# Patient Record
Sex: Male | Born: 2005
Health system: Southern US, Community
[De-identification: ages and names within clinical notes are randomized; demographics above are authoritative.]

## PROBLEM LIST (undated history)

## (undated) DIAGNOSIS — S90822A Blister (nonthermal), left foot, initial encounter: Secondary | ICD-10-CM

## (undated) DIAGNOSIS — T7840XA Allergy, unspecified, initial encounter: Secondary | ICD-10-CM

## (undated) DIAGNOSIS — H6690 Otitis media, unspecified, unspecified ear: Secondary | ICD-10-CM

## (undated) DIAGNOSIS — F909 Attention-deficit hyperactivity disorder, unspecified type: Secondary | ICD-10-CM

## (undated) HISTORY — PX: TYMPANOSTOMY TUBE PLACEMENT: SHX32

---
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
## 2024-01-30 ENCOUNTER — Other Ambulatory Visit: Payer: Self-pay

## 2024-01-30 MED ORDER — AMPHET-DEXTROAMPHET 3-BEAD ER 25 MG PO CP24
25.0000 mg | ORAL_CAPSULE | Freq: Every morning | ORAL | 0 refills | Status: DC
  Filled 2024-01-30: qty 30, 30d supply, fill #0

## 2024-03-12 ENCOUNTER — Other Ambulatory Visit: Payer: Self-pay

## 2024-03-15 ENCOUNTER — Other Ambulatory Visit: Payer: Self-pay

## 2024-03-17 ENCOUNTER — Other Ambulatory Visit: Payer: Self-pay

## 2024-03-17 ENCOUNTER — Telehealth: Payer: Self-pay | Admitting: Family Medicine

## 2024-03-17 NOTE — Telephone Encounter (Signed)
 Medication not on profile, Mydayis

## 2024-03-17 NOTE — Telephone Encounter (Unsigned)
 Copied from CRM [id number]. Topic: Clinical - Medication Refill >> Mar 17, 2024  4:35 PM Deleta RAMAN wrote: Medication: Mydayis   Has the patient contacted their pharmacy? Yes (Agent: If no, request that the patient contact the pharmacy for the refill. If patient does not wish to contact the pharmacy document the reason why and proceed with request.) (Agent: If yes, when and what did the pharmacy advise?)  This is the patient's preferred pharmacy:  [facility] [address] [place] [address] Phone: [phone] Fax: [phone]  Is this the correct pharmacy for this prescription? Yes If no, delete pharmacy and type the correct one.   Has the prescription been filled recently? Yes   Is the patient out of the medication? No, has 7 pills remaining   Has the patient been seen for an appointment in the last year OR does the patient have an upcoming appointment? Yes  Can we respond through MyChart? No  Please give his mom a call or text regarding updates on prescription at [phone]. Leave voicemail if possible  Agent: Please be advised that Rx refills may take up to 3 business days. We ask that you follow-up with your pharmacy.

## 2024-03-18 ENCOUNTER — Other Ambulatory Visit: Payer: Self-pay

## 2024-03-18 NOTE — Telephone Encounter (Signed)
 Attempted to reach parent at provided number. LM advising that we do not refill any prescriptions until patient has established care with us  here in the office. Recommend reaching out to previous PCP for refills until upcoming visit with us . Call back with any questions or concerns.

## 2024-03-19 ENCOUNTER — Other Ambulatory Visit: Payer: Self-pay

## 2024-03-19 MED ORDER — AMPHET-DEXTROAMPHET 3-BEAD ER 25 MG PO CP24
25.0000 mg | ORAL_CAPSULE | Freq: Every morning | ORAL | 0 refills | Status: DC
  Filled 2024-03-19: qty 30, 30d supply, fill #0

## 2024-04-11 LAB — COMPREHENSIVE METABOLIC PANEL WITH GFR
Albumin: 5 (ref 3.5–5.0)
Globulin: 1.8

## 2024-04-11 LAB — LIPID PANEL
Cholesterol: 179 (ref 0–200)
HDL: 56 (ref 35–70)
LDL Cholesterol: 111
Triglycerides: 54 (ref 40–160)

## 2024-04-11 LAB — HEMOGLOBIN A1C
EGFR: 148.9
HM HIV Screening: NEGATIVE
Hemoglobin A1C: 5.2

## 2024-04-11 LAB — HEPATIC FUNCTION PANEL
ALT: 42 U/L — AB (ref 10–40)
AST: 51 — AB (ref 14–40)
Alkaline Phosphatase: 93 (ref 25–125)
Bilirubin, Total: 0.8

## 2024-04-11 LAB — BASIC METABOLIC PANEL WITH GFR
BUN: 21 (ref 4–21)
Creatinine: 0.9 (ref 0.6–1.3)
Glucose: 60

## 2024-04-14 ENCOUNTER — Ambulatory Visit: Payer: Self-pay | Admitting: Family Medicine

## 2024-04-19 ENCOUNTER — Ambulatory Visit (INDEPENDENT_AMBULATORY_CARE_PROVIDER_SITE_OTHER): Payer: Self-pay | Admitting: Family Medicine

## 2024-04-19 ENCOUNTER — Other Ambulatory Visit: Payer: Self-pay

## 2024-04-19 ENCOUNTER — Encounter: Payer: Self-pay | Admitting: Family Medicine

## 2024-04-19 VITALS — BP 122/50 | HR 90 | Ht 70.0 in | Wt 155.4 lb

## 2024-04-19 DIAGNOSIS — F902 Attention-deficit hyperactivity disorder, combined type: Secondary | ICD-10-CM | POA: Diagnosis not present

## 2024-04-19 DIAGNOSIS — Z7689 Persons encountering health services in other specified circumstances: Secondary | ICD-10-CM | POA: Diagnosis not present

## 2024-04-19 DIAGNOSIS — F419 Anxiety disorder, unspecified: Secondary | ICD-10-CM

## 2024-04-19 MED ORDER — AMPHET-DEXTROAMPHET 3-BEAD ER 25 MG PO CP24
25.0000 mg | ORAL_CAPSULE | Freq: Every day | ORAL | 0 refills | Status: DC | PRN
  Filled 2024-07-29: qty 30, 30d supply, fill #0

## 2024-04-19 MED ORDER — AMPHETAMINE-DEXTROAMPHET ER 10 MG PO CP24
10.0000 mg | ORAL_CAPSULE | Freq: Every day | ORAL | 0 refills | Status: DC | PRN
  Filled 2024-04-26: qty 30, 30d supply, fill #0
  Filled ????-??-??: fill #0

## 2024-04-19 MED ORDER — AMPHET-DEXTROAMPHET 3-BEAD ER 25 MG PO CP24
25.0000 mg | ORAL_CAPSULE | Freq: Every day | ORAL | 0 refills | Status: DC | PRN
  Filled 2024-06-16: qty 30, 30d supply, fill #0

## 2024-04-19 MED ORDER — AMPHET-DEXTROAMPHET 3-BEAD ER 25 MG PO CP24
25.0000 mg | ORAL_CAPSULE | Freq: Every day | ORAL | 0 refills | Status: DC | PRN
  Filled 2024-04-26: qty 30, 30d supply, fill #0
  Filled ????-??-??: fill #0

## 2024-04-19 NOTE — Patient Instructions (Addendum)
 Thank you for coming to the office today.  Mydais generic Adderall  25 mg 3 orders sent Fill dates 8/25, 9/22, 10/20  Reviewing the recent labs  Mild elevated AST liver enzyme, 51. Goal is to repeat in 1 year and follow up on this. It is more of a curiosity, not a concern.  Cholesterol LDL 111 possibly higher based on age.  DUE for FASTING BLOOD WORK (no food or drink after midnight before the lab appointment, only water or coffee without cream/sugar on the morning of)  SCHEDULE Lab Only visit in the morning at the clinic for lab draw in 1 YEAR  - Make sure Lab Only appointment is at about 1 week before your next appointment, so that results will be available  For Lab Results, once available within 2-3 days of blood draw, you can can log in to MyChart online to view your results and a brief explanation. Also, we can discuss results at next follow-up visit.   Please schedule a Follow-up Appointment to: Return for 6 month ADHD refill mychart virtual / 1 year fasting lab > 1 week later Annual Physical.  If you have any other questions or concerns, please feel free to call the office or send a message through MyChart. You may also schedule an earlier appointment if necessary.  Additionally, you may be receiving a survey about your experience at our office within a few days to 1 week by e-mail or mail. We value your feedback.  Marsa Officer, DO [facility], [place]

## 2024-04-19 NOTE — Progress Notes (Signed)
 Subjective:    Patient ID: [name] [name], male    DOB: 07/28/2006, 18 y.o.   MRN: [id number]  [name] [name] is a 18 y.o. male presenting on 04/19/2024 for Establish Care   HPI  Discussed the use of AI scribe software for clinical note transcription with the patient, who gave verbal consent to proceed.  History of Present Illness   [name] [name] is a 18 year old male with ADHD who presents for a new patient welcome visit and medication management. He is accompanied by his mother.  Attention deficit hyperactivity symptoms - Diagnosed with ADHD in kindergarten - Symptoms include decreased focus and hyperactivity - Condition has remained steady over the years - Adapted to symptoms with current routines - Currently taking generic Adderall  XR Mydayis 25 mg on school and work days - Takes Adderall  10 mg on weekends or days requiring task completion - Physicist, medical at [employer]  Mild Anxiety symptoms - Experiencing anxiety, particularly over the past year - Attributes anxiety to work-related stress - Has a support system including family and friends No medications or treatment at this time.  Laboratory findings - Recent life insurance policy laboratory evaluation showed normal cholesterol, triglycerides, glucose, and kidney function - Mild elevation in AST liver enzyme, described as borderline - No significant weight change based on available records - No alcohol consumption  Lifestyle and supplement use - Engages in calisthenics for exercise - Currently taking a multivitamin and omega supplement      Both parents with high cholesterol, no cardiac events Both grandparents with heart disease CABG        No data to display              No data to display           Past Medical History:  Diagnosis Date   ADHD (attention deficit hyperactivity disorder)    Blister of foot, left 12/03/2011   Chronic otitis media  12/2011   current left ear infection; to remove one ear tube and place tubes both ears   Past Surgical History:  Procedure Laterality Date   ADENOIDECTOMY  03/26/2007   with BMT   MYRINGOTOMY WITH TUBE PLACEMENT Bilateral 08/06/2016   Procedure: BILATERAL MYRINGOTOMY WITH  T-TUBE PLACEMENT;  Surgeon: Camellia Milliner, MD;  Location: [facility] OR;  Service: ENT;  Laterality: Bilateral;   TONSILLECTOMY  12/07/2008   with BMT   TYMPANOSTOMY TUBE PLACEMENT  03/26/2007; 05/16/2008; 12/07/2008   Social History   Socioeconomic History   Marital status: Single    Spouse name: Not on file   Number of children: Not on file   Years of education: Not on file   Highest education level: Not on file  Occupational History   Not on file  Tobacco Use   Smoking status: Never   Smokeless tobacco: Never  Vaping Use   Vaping status: Never Used  Substance and Sexual Activity   Alcohol use: Never   Drug use: Never   Sexual activity: Never  Other Topics Concern   Not on file  Social History Narrative   Not on file   Social Drivers of Health   Financial Resource Strain: Not on file  Food Insecurity: Not on file  Transportation Needs: Not on file  Physical Activity: Not on file  Stress: Not on file  Social Connections: Not on file  Intimate Partner Violence: Not on file   Family History  Problem Relation Age of  Onset   Hypothyroidism Mother    Healthy Father    Healthy Brother    Hypertension Maternal Grandmother    Diabetes Maternal Grandfather    Heart disease Maternal Grandfather    COPD Maternal Grandfather    Heart disease Paternal Grandmother    Heart disease Paternal Grandfather    Current Outpatient Medications on File Prior to Visit  Medication Sig   cetirizine  (ZYRTEC  ALLERGY) 10 MG tablet Take 0.5 tablets (5 mg total) by mouth daily.   sodium chloride (OCEAN) 0.65 % SOLN nasal spray Place 1 spray into both nostrils as needed.   cefdinir  (OMNICEF ) 300 MG capsule Take 1 capsule (300 mg total)  by mouth 2 (two) times daily. (Patient not taking: Reported on 06/13/2023)   No current facility-administered medications on file prior to visit.    Review of Systems Per HPI unless specifically indicated above     Objective:    BP (!) 122/50 (BP Location: Left Arm, Patient Position: Sitting, Cuff Size: Small)   Pulse 90   Ht 5' 10 (1.778 m)   Wt 155 lb 6 oz (70.5 kg)   SpO2 99%   BMI 22.29 kg/m   Wt Readings from Last 3 Encounters:  04/19/24 155 lb 6 oz (70.5 kg) (54%, Z= 0.10)*  11/09/18 110 lb (49.9 kg) (52%, Z= 0.05)*  08/31/18 100 lb (45.4 kg) (37%, Z= -0.34)*   * Growth percentiles are based on CDC (Boys, 2-20 Years) data.    Physical Exam Vitals and nursing note reviewed.  Constitutional:      General: He is not in acute distress.    Appearance: Normal appearance. He is well-developed. He is not diaphoretic.     Comments: Well-appearing, comfortable, cooperative  HENT:     Head: Normocephalic and atraumatic.  Eyes:     General:        Right eye: No discharge.        Left eye: No discharge.     Conjunctiva/sclera: Conjunctivae normal.  Cardiovascular:     Rate and Rhythm: Normal rate.  Pulmonary:     Effort: Pulmonary effort is normal.  Skin:    General: Skin is warm and dry.     Findings: No erythema or rash.  Neurological:     Mental Status: He is alert and oriented to person, place, and time.  Psychiatric:        Mood and Affect: Mood normal.        Behavior: Behavior normal.        Thought Content: Thought content normal.     Comments: Well groomed, good eye contact, normal speech and thoughts     Results for orders placed or performed in visit on 04/19/24  Basic metabolic panel with GFR   Collection Time: 04/11/24 12:00 AM  Result Value Ref Range   Glucose 60    BUN 21 4 - 21   Creatinine 0.9 0.6 - 1.3  Comprehensive metabolic panel with GFR   Collection Time: 04/11/24 12:00 AM  Result Value Ref Range   Globulin 1.8    Albumin 5.0 3.5 -  5.0  Lipid panel   Collection Time: 04/11/24 12:00 AM  Result Value Ref Range   Triglycerides 54 40 - 160   Cholesterol 179 0 - 200   HDL 56 35 - 70   LDL Cholesterol 111   Hepatic function panel   Collection Time: 04/11/24 12:00 AM  Result Value Ref Range   Alkaline Phosphatase 93 25 -  125   ALT 42 (A) 10 - 40 U/L   AST 51 (A) 14 - 40   Bilirubin, Total 0.8   Hemoglobin A1c   Collection Time: 04/11/24 12:00 AM  Result Value Ref Range   Hemoglobin A1C 5.2       Assessment & Plan:   Problem List Items Addressed This Visit     Attention deficit hyperactivity disorder (ADHD), combined type - Primary   Relevant Medications   Amphet-Dextroamphet 3-Bead ER 25 MG CP24 (Start on 06/21/2024)   Amphet-Dextroamphet 3-Bead ER 25 MG CP24 (Start on 04/26/2024)   amphetamine -dextroamphetamine  (ADDERALL  XR) 10 MG 24 hr capsule (Start on 04/26/2024)   Amphet-Dextroamphet 3-Bead ER 25 MG CP24 (Start on 05/24/2024)   Other Visit Diagnoses       Encounter to establish care with new doctor         Anxiety            Updated Health Maintenance information Encouraged improvement to lifestyle with diet and exercise   Attention-deficit hyperactivity disorder (ADHD), combined type ADHD symptoms well-managed with Adderall  and Generic Mydayis. Scientist, physiological at [employer] Discussed telehealth for follow-ups. - Prescribe Generic Mydayis 25 mg daily (extended adderall  formulation), three orders with fill dates on 8/25, 9/22, and 10/20. - Prescribe Adderall  10 mg, to be filled on 8/25. - Schedule follow-up in six months for ADHD management, potentially via telehealth. - Instruct to message via MyChart for future refills after the third order is completed.  Mild situational anxiety Mild situational anxiety related to work and school stress. No current psychiatric intervention needed. Support systems in place. Discussed referral to counselor if needed. - Encourage use of support systems,  including family and friends. - Offer referral to a counselor if anxiety becomes unmanageable.  Mildly elevated AST liver enzyme Mildly elevated AST at 51. No significant weight gain or cholesterol issues. Possible age-related or supplement-related elevation. No immediate concern, will monitor. - Repeat liver enzyme test in one year. - Advise to return for blood work if concerns arise before annual check-up.  Possible borderline elevated LDL cholesterol (age-related) LDL possibly borderline elevated for age, within normal range for adults. Family history of heart disease and high cholesterol. No immediate concern, advised healthy diet. - Advise to maintain a healthy diet, including vegetables. - Monitor cholesterol levels during annual check-up.       Future 6 month MyChart Video ADHD refill authorization visit   Orders Placed This Encounter  Procedures   Basic metabolic panel with GFR    This external order was created through the Results Console.   Comprehensive metabolic panel with GFR    This external order was created through the Results Console.   Lipid panel    This external order was created through the Results Console.   Hepatic function panel    This external order was created through the Results Console.   Hemoglobin A1c    This external order was created through the Results Console.    Meds ordered this encounter  Medications   Amphet-Dextroamphet 3-Bead ER 25 MG CP24    Sig: Take 1 capsule (25 mg total) by mouth daily as needed (inattention ADHD).    Dispense:  30 capsule    Refill:  0    First fill 06/21/24   Amphet-Dextroamphet 3-Bead ER 25 MG CP24    Sig: Take 1 capsule (25 mg total) by mouth daily as needed (inattention ADHD).    Dispense:  30 capsule  Refill:  0    First fill 04/26/24   amphetamine -dextroamphetamine  (ADDERALL  XR) 10 MG 24 hr capsule    Sig: Take 1 capsule (10 mg total) by mouth daily as needed (inattention ADHD).    Dispense:  30  capsule    Refill:  0   Amphet-Dextroamphet 3-Bead ER 25 MG CP24    Sig: Take 1 capsule (25 mg total) by mouth daily as needed (inattention ADHD).    Dispense:  30 capsule    Refill:  0    First fill 05/24/24     Follow up plan: Return for 6 month ADHD refill mychart virtual / 1 year fasting lab > 1 week later Annual Physical.  Marsa Officer, DO [facility] 04/19/2024, 10:27 AM

## 2024-04-26 ENCOUNTER — Other Ambulatory Visit: Payer: Self-pay

## 2024-05-14 ENCOUNTER — Other Ambulatory Visit: Payer: Self-pay

## 2024-05-14 ENCOUNTER — Ambulatory Visit: Admitting: Podiatry

## 2024-05-14 DIAGNOSIS — L600 Ingrowing nail: Secondary | ICD-10-CM | POA: Diagnosis not present

## 2024-05-14 MED ORDER — DOXYCYCLINE HYCLATE 100 MG PO TABS
100.0000 mg | ORAL_TABLET | Freq: Two times a day (BID) | ORAL | 0 refills | Status: AC
  Filled 2024-05-14: qty 20, 10d supply, fill #0

## 2024-05-14 NOTE — Progress Notes (Signed)

## 2024-05-14 NOTE — Progress Notes (Signed)
   Chief Complaint  Patient presents with   Toe Pain    Ingrown toe nail right great toe    Subjective: Patient presents today for evaluation of pain to the medial and lateral border of the right great toe. Patient is concerned for possible ingrown nail.  It is very sensitive to touch.  Patient presents today for further treatment and evaluation.  Past Medical History:  Diagnosis Date   ADHD (attention deficit hyperactivity disorder)    Blister of foot, left 12/03/2011   Chronic otitis media 12/2011   current left ear infection; to remove one ear tube and place tubes both ears    Past Surgical History:  Procedure Laterality Date   ADENOIDECTOMY  03/26/2007   with BMT   MYRINGOTOMY WITH TUBE PLACEMENT Bilateral 08/06/2016   Procedure: BILATERAL MYRINGOTOMY WITH  T-TUBE PLACEMENT;  Surgeon: Camellia Milliner, MD;  Location: [facility] OR;  Service: ENT;  Laterality: Bilateral;   TONSILLECTOMY  12/07/2008   with BMT   TYMPANOSTOMY TUBE PLACEMENT  03/26/2007; 05/16/2008; 12/07/2008    No Known Allergies  Objective:  General: Well developed, nourished, in no acute distress, alert and oriented x3   Dermatology: Skin is warm, dry and supple bilateral.  Medial and lateral border right great toe is tender with evidence of an ingrowing nail. Pain on palpation noted to the border of the nail fold. The remaining nails appear unremarkable at this time.   Vascular: DP and PT pulses palpable.  No clinical evidence of vascular compromise  Neruologic: Grossly intact via light touch bilateral.  Musculoskeletal: No pedal deformity noted  Assesement: #1 Paronychia with ingrowing nail medial and lateral border right great toe #2 history of partial nail matricectomy medial and lateral border left great toe  Plan of Care:  -Patient evaluated.  -Discussed treatment alternatives and plan of care. Explained nail avulsion procedure and post procedure course to patient. -Patient opted for permanent partial nail  avulsion of the ingrown portion of the nail.  -Prior to procedure, local anesthesia infiltration utilized using 3 ml of a 50:50 mixture of 2% plain lidocaine  and 0.5% plain marcaine in a normal hallux block fashion and a betadine prep performed.  -Partial permanent nail avulsion with chemical matrixectomy performed using 3x30sec applications of phenol followed by alcohol flush.  -Light dressing applied.  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 Thresa EMERSON Sar, DPM    [address] [place], [place] [address]                Office [phone]  Fax [phone]

## 2024-05-26 DIAGNOSIS — H6983 Other specified disorders of Eustachian tube, bilateral: Secondary | ICD-10-CM | POA: Diagnosis not present

## 2024-06-01 ENCOUNTER — Ambulatory Visit (INDEPENDENT_AMBULATORY_CARE_PROVIDER_SITE_OTHER): Admitting: Podiatry

## 2024-06-01 DIAGNOSIS — L600 Ingrowing nail: Secondary | ICD-10-CM | POA: Diagnosis not present

## 2024-06-01 NOTE — Progress Notes (Signed)
   Chief Complaint  Patient presents with   Ingrown Toenail    Pt is here to f/u on right foot after having ingrown removed he states no pain or drainage from the area.    Subjective: 18 y.o. male presents today status post permanent nail avulsion procedure of the medial and lateral border of the right great toe that was performed on 05/14/2024.   Past Medical History:  Diagnosis Date   ADHD (attention deficit hyperactivity disorder)    Blister of foot, left 12/03/2011   Chronic otitis media 12/2011   current left ear infection; to remove one ear tube and place tubes both ears    Objective: Neurovascular status intact.  Skin is warm, dry and supple. Nail and respective nail fold appears to be healing appropriately.   Assessment: #1 s/p partial permanent nail matrixectomy medial and lateral border right great toe #2 h/o partial nail matrixectomy medial and lateral border left great toe 06/13/2023   Plan of care: -Patient was evaluated  -Light debridement of the periungual debris was performed to the border of the respective toe and nail plate using a tissue nipper. -Patient is to return to clinic on a PRN basis.   Thresa EMERSON Sar, DPM [facility]  Dr. Thresa EMERSON Sar, DPM    [address] [place], [place] [address]                Office [phone]  Fax [phone]

## 2024-06-15 ENCOUNTER — Other Ambulatory Visit: Payer: Self-pay | Admitting: Family Medicine

## 2024-06-15 DIAGNOSIS — F902 Attention-deficit hyperactivity disorder, combined type: Secondary | ICD-10-CM

## 2024-06-16 ENCOUNTER — Other Ambulatory Visit: Payer: Self-pay

## 2024-07-30 ENCOUNTER — Other Ambulatory Visit: Payer: Self-pay

## 2024-09-04 ENCOUNTER — Other Ambulatory Visit: Payer: Self-pay | Admitting: Family Medicine

## 2024-09-04 ENCOUNTER — Other Ambulatory Visit: Payer: Self-pay

## 2024-09-04 ENCOUNTER — Other Ambulatory Visit (HOSPITAL_COMMUNITY): Payer: Self-pay

## 2024-09-04 DIAGNOSIS — F902 Attention-deficit hyperactivity disorder, combined type: Secondary | ICD-10-CM

## 2024-09-06 ENCOUNTER — Other Ambulatory Visit: Payer: Self-pay

## 2024-09-06 MED ORDER — AMPHET-DEXTROAMPHET 3-BEAD ER 25 MG PO CP24: 25.0000 mg | ORAL_CAPSULE | Freq: Every day | ORAL | 0 refills | Status: AC | PRN

## 2024-09-06 MED ORDER — AMPHET-DEXTROAMPHET 3-BEAD ER 25 MG PO CP24
25.0000 mg | ORAL_CAPSULE | Freq: Every day | ORAL | 0 refills | Status: AC | PRN
  Filled 2024-09-06: qty 30, 30d supply, fill #0

## 2024-09-06 NOTE — Telephone Encounter (Signed)
 Requested medication (s) are due for refill today: yes  Requested medication (s) are on the active medication list: yes  Last refill:  04/19/24  Future visit scheduled: yes  Notes to clinic:  Unable to refill per protocol, cannot delegate.      Requested Prescriptions  Pending Prescriptions Disp Refills   Amphet-Dextroamphet 3-Bead ER 25 MG CP24 30 capsule 0    Sig: Take 1 capsule (25 mg total) by mouth daily as needed (inattention ADHD).     Not Delegated - Psychiatry:  Stimulants/ADHD Failed - 09/06/2024  4:01 PM      Failed - This refill cannot be delegated      Failed - Urine Drug Screen completed in last 360 days      Passed - Last BP in normal range    BP Readings from Last 1 Encounters:  04/19/24 (!) 122/50         Passed - Last Heart Rate in normal range    Pulse Readings from Last 1 Encounters:  04/19/24 90         Passed - Valid encounter within last 6 months    Recent Outpatient Visits           4 months ago Attention deficit hyperactivity disorder (ADHD), combined type   [facility] [place], [place], [place]

## 2024-09-07 ENCOUNTER — Other Ambulatory Visit: Payer: Self-pay

## 2024-09-09 ENCOUNTER — Other Ambulatory Visit: Payer: Self-pay

## 2024-09-09 ENCOUNTER — Other Ambulatory Visit: Payer: Self-pay | Admitting: Family Medicine

## 2024-09-09 DIAGNOSIS — F902 Attention-deficit hyperactivity disorder, combined type: Secondary | ICD-10-CM

## 2024-09-10 ENCOUNTER — Other Ambulatory Visit: Payer: Self-pay

## 2024-09-10 MED ORDER — AMPHETAMINE-DEXTROAMPHET ER 10 MG PO CP24
10.0000 mg | ORAL_CAPSULE | Freq: Every day | ORAL | 0 refills | Status: AC | PRN
  Filled 2024-09-10: qty 30, 30d supply, fill #0

## 2024-09-10 NOTE — Telephone Encounter (Signed)
 Requested medication (s) are due for refill today: yes  Requested medication (s) are on the active medication list: yes  Last refill:  04/19/24  Future visit scheduled: yes  Notes to clinic:  Unable to refill per protocol, cannot delegate.       Requested Prescriptions  Pending Prescriptions Disp Refills   amphetamine -dextroamphetamine  (ADDERALL  XR) 10 MG 24 hr capsule 30 capsule 0    Sig: Take 1 capsule (10 mg total) by mouth daily as needed (inattention ADHD).     Not Delegated - Psychiatry:  Stimulants/ADHD Failed - 09/10/2024  1:27 PM      Failed - This refill cannot be delegated      Failed - Urine Drug Screen completed in last 360 days      Passed - Last BP in normal range    BP Readings from Last 1 Encounters:  04/19/24 (!) 122/50         Passed - Last Heart Rate in normal range    Pulse Readings from Last 1 Encounters:  04/19/24 90         Passed - Valid encounter within last 6 months    Recent Outpatient Visits           4 months ago Attention deficit hyperactivity disorder (ADHD), combined type   [facility] [place], [place], [place]

## 2025-04-18 ENCOUNTER — Other Ambulatory Visit: Payer: Self-pay

## 2025-04-19 ENCOUNTER — Other Ambulatory Visit: Payer: Self-pay

## 2025-04-25 ENCOUNTER — Encounter: Payer: Self-pay | Admitting: Family Medicine
